# Patient Record
Sex: Male | Born: 1980 | Hispanic: Yes | Marital: Single | Smoking: Never smoker
Health system: Southern US, Community
[De-identification: ages and names within clinical notes are randomized; demographics above are authoritative.]

## PROBLEM LIST (undated history)

## (undated) DIAGNOSIS — K5190 Ulcerative colitis, unspecified, without complications: Secondary | ICD-10-CM

## (undated) DIAGNOSIS — D500 Iron deficiency anemia secondary to blood loss (chronic): Secondary | ICD-10-CM

---

## 2018-12-22 ENCOUNTER — Emergency Department (HOSPITAL_COMMUNITY)
Admission: EM | Admit: 2018-12-22 | Discharge: 2018-12-22 | Disposition: A | Discharge status: Home / Self Care | Payer: Self-pay | Attending: Emergency Medicine | Admitting: Emergency Medicine

## 2018-12-22 ENCOUNTER — Other Ambulatory Visit: Payer: Self-pay

## 2018-12-22 ENCOUNTER — Encounter (HOSPITAL_COMMUNITY): Payer: Self-pay | Admitting: Emergency Medicine

## 2018-12-22 ENCOUNTER — Emergency Department (HOSPITAL_COMMUNITY): Payer: Self-pay

## 2018-12-22 DIAGNOSIS — D649 Anemia, unspecified: Secondary | ICD-10-CM | POA: Insufficient documentation

## 2018-12-22 DIAGNOSIS — K529 Noninfective gastroenteritis and colitis, unspecified: Secondary | ICD-10-CM | POA: Insufficient documentation

## 2018-12-22 LAB — COMPREHENSIVE METABOLIC PANEL
ALT: 14 U/L (ref 0–44)
AST: 13 U/L — ABNORMAL LOW (ref 15–41)
Albumin: 2.1 g/dL — ABNORMAL LOW (ref 3.5–5.0)
Alkaline Phosphatase: 109 U/L (ref 38–126)
Anion gap: 9 (ref 5–15)
BUN: 11 mg/dL (ref 6–20)
CO2: 25 mmol/L (ref 22–32)
Calcium: 8 mg/dL — ABNORMAL LOW (ref 8.9–10.3)
Chloride: 97 mmol/L — ABNORMAL LOW (ref 98–111)
Creatinine, Ser: 0.82 mg/dL (ref 0.61–1.24)
GFR calc Af Amer: >60 mL/min (ref >60)
GFR calc non Af Amer: >60 mL/min (ref >60)
Glucose, Bld: 103 mg/dL — ABNORMAL HIGH (ref 70–99)
Potassium: 4.2 mmol/L (ref 3.5–5.1)
Sodium: 131 mmol/L — ABNORMAL LOW (ref 135–145)
Total Bilirubin: 0.5 mg/dL (ref 0.3–1.2)
Total Protein: 6.2 g/dL — ABNORMAL LOW (ref 6.5–8.1)

## 2018-12-22 LAB — LIPASE, BLOOD: Lipase: 15 U/L (ref 11–51)

## 2018-12-22 LAB — CBC
HCT: 30.3 % — ABNORMAL LOW (ref 39.0–52.0)
Hemoglobin: 9.2 g/dL — ABNORMAL LOW (ref 13.0–17.0)
MCH: 26.3 pg (ref 26.0–34.0)
MCHC: 30.4 g/dL (ref 30.0–36.0)
MCV: 86.6 fL (ref 80.0–100.0)
Platelets: 495 10*3/uL — ABNORMAL HIGH (ref 150–400)
RBC: 3.5 MIL/uL — ABNORMAL LOW (ref 4.22–5.81)
RDW: 13.9 % (ref 11.5–15.5)
WBC: 7.7 10*3/uL (ref 4.0–10.5)
nRBC: 0 % (ref 0.0–0.2)

## 2018-12-22 LAB — URINALYSIS, ROUTINE W REFLEX MICROSCOPIC
Bilirubin Urine: NEGATIVE
Glucose, UA: NEGATIVE mg/dL
Hgb urine dipstick: NEGATIVE
Ketones, ur: 5 mg/dL — AB
Leukocytes,Ua: NEGATIVE
Nitrite: NEGATIVE
Protein, ur: NEGATIVE mg/dL
Specific Gravity, Urine: 1.011 (ref 1.005–1.030)
pH: 7 (ref 5.0–8.0)

## 2018-12-22 MED ORDER — IOHEXOL 300 MG/ML  SOLN
30.0000 mL | Freq: Once | INTRAMUSCULAR | Status: AC | PRN
  Administered 2018-12-22: 30 mL via ORAL

## 2018-12-22 MED ORDER — METRONIDAZOLE 500 MG PO TABS
500.0000 mg | ORAL_TABLET | Freq: Once | ORAL | Status: AC
  Administered 2018-12-22: 500 mg via ORAL
  Filled 2018-12-22: qty 1

## 2018-12-22 MED ORDER — IOHEXOL 300 MG/ML  SOLN
100.0000 mL | Freq: Once | INTRAMUSCULAR | Status: AC | PRN
  Administered 2018-12-22: 100 mL via INTRAVENOUS

## 2018-12-22 MED ORDER — CIPROFLOXACIN HCL 500 MG PO TABS
500.0000 mg | ORAL_TABLET | Freq: Once | ORAL | Status: AC
  Administered 2018-12-22: 500 mg via ORAL
  Filled 2018-12-22: qty 1

## 2018-12-22 MED ORDER — SODIUM CHLORIDE 0.9 % IV BOLUS (SEPSIS)
1000.0000 mL | Freq: Once | INTRAVENOUS | Status: AC
  Administered 2018-12-22: 16:00:00 1000 mL via INTRAVENOUS

## 2018-12-22 MED ORDER — SODIUM CHLORIDE 0.9 % IV SOLN: 1000.0000 mL | INTRAVENOUS | Status: DC

## 2018-12-22 MED ORDER — METRONIDAZOLE 500 MG PO TABS: 500.0000 mg | ORAL_TABLET | Freq: Three times a day (TID) | ORAL | 0 refills | Status: DC

## 2018-12-22 MED ORDER — SODIUM CHLORIDE (PF) 0.9 % IJ SOLN
INTRAMUSCULAR | Status: AC
  Filled 2018-12-22: qty 50

## 2018-12-22 MED ORDER — CIPROFLOXACIN HCL 500 MG PO TABS: 500.0000 mg | ORAL_TABLET | Freq: Two times a day (BID) | ORAL | 0 refills | Status: DC

## 2018-12-22 NOTE — ED Triage Notes (Signed)
Pt c/o abd pains with v/d for 4 weeks.

## 2018-12-22 NOTE — ED Provider Notes (Signed)
[facility]-EMERGENCY DEPT Provider Note   CSN: [id number] Arrival date & time: 12/22/18  1457    History   Chief Complaint Chief Complaint  Patient presents with   Abdominal Pain   Emesis   Diarrhea    HPI Cesar Miller is a 38 y.o. male.     HPI Pt has been having abdominal pain, vomiting and diarrhea.  He has been having diarrhea for 4 months.  During the day not too much, but at night he will have episodes every 20 to 30 minutes.  He has noticed blood in the stool at times.  Last night he had an episode of vomiting but prior to that it was 4 days ago.  The pain in the abdomen is up top and just sometimes.  It is not too severe. He has not seen anyone for it since it started.  He came in today because he could not sleep because he was having more trouble and could not sleep.  No fevers.    Pt used to drink alcohol daily but has not had anything to drink in months.   NO family history of medical problems.  History reviewed. No pertinent past medical history.  There are no active problems to display for this patient.   History reviewed. No pertinent surgical history.      Home Medications    Prior to Admission medications   Medication Sig Start Date End Date Taking? Authorizing Provider  ciprofloxacin (CIPRO) 500 MG tablet Take 1 tablet (500 mg total) by mouth 2 (two) times daily. 12/22/18   Linwood Dibbles, MD  metroNIDAZOLE (FLAGYL) 500 MG tablet Take 1 tablet (500 mg total) by mouth 3 (three) times daily. 12/22/18   Linwood Dibbles, MD    Family History No family history on file.  Social History Social History   Tobacco Use   Smoking status: Never Smoker   Smokeless tobacco: Never Used  Substance Use Topics   Alcohol use: Not on file   Drug use: Not on file     Allergies   Patient has no known allergies.   Review of Systems Review of Systems  All other systems reviewed and are negative.    Physical Exam Updated Vital  Signs BP 120/87    Pulse 89    Temp 98.7 F (37.1 C)    Resp 16    SpO2 100%   Physical Exam Vitals signs and nursing note reviewed.  Constitutional:      General: He is not in acute distress.    Appearance: He is well-developed.  HENT:     Head: Normocephalic and atraumatic.     Right Ear: External ear normal.     Left Ear: External ear normal.  Eyes:     General: No scleral icterus.       Right eye: No discharge.        Left eye: No discharge.     Conjunctiva/sclera: Conjunctivae normal.     Comments: Conjunctiva pale   Neck:     Musculoskeletal: Neck supple.     Trachea: No tracheal deviation.  Cardiovascular:     Rate and Rhythm: Normal rate and regular rhythm.  Pulmonary:     Effort: Pulmonary effort is normal. No respiratory distress.     Breath sounds: Normal breath sounds. No stridor. No wheezing or rales.  Abdominal:     General: Bowel sounds are normal. There is no distension.     Palpations: Abdomen is soft.  Tenderness: There is no abdominal tenderness. There is no guarding or rebound.  Musculoskeletal:        General: No tenderness.  Skin:    General: Skin is warm and dry.     Findings: No rash.  Neurological:     Mental Status: He is alert.     Cranial Nerves: No cranial nerve deficit (no facial droop, extraocular movements intact, no slurred speech).     Sensory: No sensory deficit.     Motor: No abnormal muscle tone or seizure activity.     Coordination: Coordination normal.      ED Treatments / Results  Labs (all labs ordered are listed, but only abnormal results are displayed) Labs Reviewed  COMPREHENSIVE METABOLIC PANEL - Abnormal; Notable for the following components:      Result Value   Sodium 131 (*)    Chloride 97 (*)    Glucose, Bld 103 (*)    Calcium 8.0 (*)    Total Protein 6.2 (*)    Albumin 2.1 (*)    AST 13 (*)    All other components within normal limits  CBC - Abnormal; Notable for the following components:   RBC 3.50 (*)     Hemoglobin 9.2 (*)    HCT 30.3 (*)    Platelets 495 (*)    All other components within normal limits  URINALYSIS, ROUTINE W REFLEX MICROSCOPIC - Abnormal; Notable for the following components:   Ketones, ur 5 (*)    All other components within normal limits  LIPASE, BLOOD     Radiology Ct Abdomen Pelvis W Contrast  Result Date: 12/22/2018 CLINICAL DATA:  Abdominal pain EXAM: CT ABDOMEN AND PELVIS WITH CONTRAST TECHNIQUE: Multidetector CT imaging of the abdomen and pelvis was performed using the standard protocol following bolus administration of intravenous contrast. CONTRAST:  100mL OMNIPAQUE IOHEXOL 300 MG/ML SOLN, 30mL OMNIPAQUE IOHEXOL 300 MG/ML SOLN COMPARISON:  None. FINDINGS: Lower chest: Lung bases are clear. No effusions. Heart is normal size. Hepatobiliary: Low-density area anteriorly near the falciform ligament could reflect fatty infiltration or cyst. Gallbladder is grossly unremarkable. Pancreas: No focal abnormality or ductal dilatation. Spleen: No focal abnormality.  Normal size. Adrenals/Urinary Tract: No adrenal abnormality. No focal renal abnormality. No stones or hydronephrosis. Urinary bladder is unremarkable. Stomach/Bowel: There is wall thickening within the left colon from the splenic flexure through the sigmoid colon compatible with infectious or inflammatory colitis. Stomach and small bowel decompressed. No evidence of bowel obstruction. Vascular/Lymphatic: No evidence of aneurysm or adenopathy. Numerous mildly prominent mesenteric lymph nodes adjacent to the left colon. Reproductive: No visible focal abnormality. Other: No free fluid or free air. Musculoskeletal: No acute bony abnormality. IMPRESSION: Wall thickening in the left colon from the splenic flexure through the sigmoid colon with associated surrounding stranding and prominent mesenteric lymph nodes. Findings compatible with infectious or inflammatory colitis. Electronically Signed   By: Charlett NoseKevin  Dover M.D.   On:  12/22/2018 21:23    Procedures Procedures (including critical care time)  Medications Ordered in ED Medications  sodium chloride 0.9 % bolus 1,000 mL (0 mLs Intravenous Stopped 12/22/18 1806)    Followed by  sodium chloride 0.9 % bolus 1,000 mL (0 mLs Intravenous Stopped 12/22/18 1806)  iohexol (OMNIPAQUE) 300 MG/ML solution 100 mL (100 mLs Intravenous Contrast Given 12/22/18 2107)  iohexol (OMNIPAQUE) 300 MG/ML solution 30 mL (30 mLs Oral Contrast Given 12/22/18 2107)  ciprofloxacin (CIPRO) tablet 500 mg (500 mg Oral Given 12/22/18 2212)  metroNIDAZOLE (FLAGYL) tablet  500 mg (500 mg Oral Given 12/22/18 2212)     Initial Impression / Assessment and Plan / ED Course  I have reviewed the triage vital signs and the nursing notes.  Pertinent labs & imaging results that were available during my care of the patient were reviewed by me and considered in my medical decision making (see chart for details).  Clinical Course as of Dec 23 908  Sat Dec 22, 2018  1711 HGB low.  CMET otherwise unremarkable   [JK]  1753 No gross blood in stool.  Guaiac negative   [JK]  2134 CT scan reviewed   [JK]  2137 Findings consistent with inflammatory/infectious colitis.  Suspect inflammatory considering the chronicity   [JK]  2148 D/w Dr Georgiann Cocker.  OK to Dc on abx.  Follow up in office.    [JK]    Clinical Course User Index [JK] Linwood Dibbles, MD     Pt presented with complaints of recurrent abdominal pain for months.  Recently noticed blood in his stools.  Labs notable for an anemia that is new compared to previous labs this year available in the EMR.  CT scan showed findings consistent with colitis.  No gross blood in stool to suggest active bleeding, guaic neg.  Sx concerning for possible inflam coitis considering the chronicity.  Discussed with Dr. Georgiann Cocker, GI for recommendations.  Dc home on abx.  Outpatient follow up in GI office.  Final Clinical Impressions(s) / ED Diagnoses   Final diagnoses:    Colitis  Anemia, unspecified type    ED Discharge Orders         Ordered    ciprofloxacin (CIPRO) 500 MG tablet  2 times daily     12/22/18 2201    metroNIDAZOLE (FLAGYL) 500 MG tablet  3 times daily     12/22/18 2201        Note:spanish language translator used during visit at all times   Linwood Dibbles, MD 12/24/18 [phone]

## 2018-12-22 NOTE — ED Notes (Signed)
Pt back from CT

## 2018-12-22 NOTE — ED Notes (Signed)
ED Provider at bedside. 

## 2018-12-22 NOTE — ED Notes (Signed)
UA sent down to lab with culture

## 2018-12-22 NOTE — Discharge Instructions (Addendum)
Take the medications until they are completely finished.  Call Dr Rito Ehrlich office on Monday to schedule an appointment.  Tome los medicamentos hasta que estn completamente terminados. Llame a la oficina del Dr. Chancy Milroy el lunes para programar una cita.

## 2018-12-22 NOTE — ED Notes (Signed)
Pt ambulated to CT

## 2018-12-22 NOTE — ED Notes (Signed)
Patient transported to CT 

## 2018-12-22 NOTE — ED Notes (Signed)
Patient made aware urine sample is needed. Urinal provided at bedside. 

## 2019-02-23 ENCOUNTER — Other Ambulatory Visit: Payer: Self-pay

## 2019-02-23 ENCOUNTER — Encounter (HOSPITAL_COMMUNITY): Payer: Self-pay

## 2019-02-23 ENCOUNTER — Emergency Department (HOSPITAL_COMMUNITY)
Admission: EM | Admit: 2019-02-23 | Discharge: 2019-02-23 | Disposition: A | Discharge status: Home / Self Care | Payer: Self-pay | Attending: Emergency Medicine | Admitting: Emergency Medicine

## 2019-02-23 DIAGNOSIS — R197 Diarrhea, unspecified: Secondary | ICD-10-CM | POA: Insufficient documentation

## 2019-02-23 DIAGNOSIS — R34 Anuria and oliguria: Secondary | ICD-10-CM | POA: Insufficient documentation

## 2019-02-23 DIAGNOSIS — R300 Dysuria: Secondary | ICD-10-CM | POA: Insufficient documentation

## 2019-02-23 DIAGNOSIS — K921 Melena: Secondary | ICD-10-CM | POA: Insufficient documentation

## 2019-02-23 DIAGNOSIS — R1011 Right upper quadrant pain: Secondary | ICD-10-CM | POA: Insufficient documentation

## 2019-02-23 DIAGNOSIS — R6883 Chills (without fever): Secondary | ICD-10-CM | POA: Insufficient documentation

## 2019-02-23 LAB — CBC WITH DIFFERENTIAL/PLATELET
Abs Immature Granulocytes: 0.03 10*3/uL (ref 0.00–0.07)
Basophils Absolute: 0 10*3/uL (ref 0.0–0.1)
Basophils Relative: 0 %
Eosinophils Absolute: 0 10*3/uL (ref 0.0–0.5)
Eosinophils Relative: 0 %
HCT: 34.4 % — ABNORMAL LOW (ref 39.0–52.0)
Hemoglobin: 10.4 g/dL — ABNORMAL LOW (ref 13.0–17.0)
Immature Granulocytes: 0 %
Lymphocytes Relative: 28 %
Lymphs Abs: 2 10*3/uL (ref 0.7–4.0)
MCH: 25.7 pg — ABNORMAL LOW (ref 26.0–34.0)
MCHC: 30.2 g/dL (ref 30.0–36.0)
MCV: 85.1 fL (ref 80.0–100.0)
Monocytes Absolute: 0.6 10*3/uL (ref 0.1–1.0)
Monocytes Relative: 8 %
Neutro Abs: 4.6 10*3/uL (ref 1.7–7.7)
Neutrophils Relative %: 64 %
Platelets: 605 10*3/uL — ABNORMAL HIGH (ref 150–400)
RBC: 4.04 MIL/uL — ABNORMAL LOW (ref 4.22–5.81)
RDW: 15.2 % (ref 11.5–15.5)
WBC: 7.2 10*3/uL (ref 4.0–10.5)
nRBC: 0 % (ref 0.0–0.2)

## 2019-02-23 LAB — URINALYSIS, ROUTINE W REFLEX MICROSCOPIC
Bacteria, UA: NONE SEEN
Glucose, UA: NEGATIVE mg/dL
Hgb urine dipstick: NEGATIVE
Ketones, ur: 5 mg/dL — AB
Leukocytes,Ua: NEGATIVE
Nitrite: NEGATIVE
Protein, ur: 30 mg/dL — AB
Specific Gravity, Urine: 1.028 (ref 1.005–1.030)
pH: 5 (ref 5.0–8.0)

## 2019-02-23 LAB — COMPREHENSIVE METABOLIC PANEL
ALT: 15 U/L (ref 0–44)
AST: 14 U/L — ABNORMAL LOW (ref 15–41)
Albumin: 2 g/dL — ABNORMAL LOW (ref 3.5–5.0)
Alkaline Phosphatase: 134 U/L — ABNORMAL HIGH (ref 38–126)
Anion gap: 10 (ref 5–15)
BUN: 11 mg/dL (ref 6–20)
CO2: 26 mmol/L (ref 22–32)
Calcium: 8.2 mg/dL — ABNORMAL LOW (ref 8.9–10.3)
Chloride: 98 mmol/L (ref 98–111)
Creatinine, Ser: 0.75 mg/dL (ref 0.61–1.24)
GFR calc Af Amer: >60 mL/min (ref >60)
GFR calc non Af Amer: >60 mL/min (ref >60)
Glucose, Bld: 88 mg/dL (ref 70–99)
Potassium: 4 mmol/L (ref 3.5–5.1)
Sodium: 134 mmol/L — ABNORMAL LOW (ref 135–145)
Total Bilirubin: 0.4 mg/dL (ref 0.3–1.2)
Total Protein: 6.6 g/dL (ref 6.5–8.1)

## 2019-02-23 LAB — LIPASE, BLOOD: Lipase: 58 U/L — ABNORMAL HIGH (ref 11–51)

## 2019-02-23 MED ORDER — SODIUM CHLORIDE 0.9 % IV BOLUS
1000.0000 mL | Freq: Once | INTRAVENOUS | Status: AC
  Administered 2019-02-23: 1000 mL via INTRAVENOUS

## 2019-02-23 NOTE — ED Notes (Signed)
Bladder scan 39mL.

## 2019-02-23 NOTE — ED Provider Notes (Signed)
[facility]-EMERGENCY DEPT Provider Note   CSN: [id number] Arrival date & time: 02/23/19  1017    History   Chief Complaint Chief Complaint  Patient presents with  . Dysuria    HPI Cesar Miller is a 38 y.o. male.  His chief complaint is dysuria.  Symptoms started primary plenty since yesterday morning.  States he noted some burning every time he tried to pee and also noticed that there was a decreased amount of urine coming out.  States symptoms have not changed significantly since yesterday.  Currently denies any active abdominal pain at rest.  Says over the past 3 weeks he has noted some "pain in liver", states this is worsened when eating foods, unable to identify specific food triggers.  Currently not having any pain in his right upper quadrant.  Had chills yesterday, denies any documented fevers.  Denies any nausea, vomiting.  Cough, chest pain or difficulty breathing.  Patient denies any medical problems.  Patient was seen in our emergency department earlier this year, diagnosed with colitis and completed antibiotic treatment.  Also having blood in his stools and had hemoglobin 9.2.  Patient has not followed up with outpatient doctor since that time.  States he continues to have intermittent loose stools with occasional streaks of blood, last episode was a few days ago.  Has since had normal bowel movements.  Additional history is obtained through chart review.  Previously documented history of alcohol abuse, patient denies any ongoing alcohol use.     HPI  History reviewed. No pertinent past medical history.  There are no active problems to display for this patient.   History reviewed. No pertinent surgical history.      Home Medications    Prior to Admission medications   Medication Sig Start Date End Date Taking? Authorizing Provider  ciprofloxacin (CIPRO) 500 MG tablet Take 1 tablet (500 mg total) by mouth 2 (two) times daily. Patient  not taking: Reported on 02/23/2019 12/22/18   Linwood DibblesKnapp, Jon, MD  metroNIDAZOLE (FLAGYL) 500 MG tablet Take 1 tablet (500 mg total) by mouth 3 (three) times daily. Patient not taking: Reported on 02/23/2019 12/22/18   Linwood DibblesKnapp, Jon, MD    Family History No family history on file.  Social History Social History   Tobacco Use  . Smoking status: Never Smoker  . Smokeless tobacco: Never Used  Substance Use Topics  . Alcohol use: Not on file  . Drug use: Not on file     Allergies   Patient has no known allergies.   Review of Systems Review of Systems  Constitutional: Negative for chills and fever.  HENT: Negative for ear pain and sore throat.   Eyes: Negative for pain and visual disturbance.  Respiratory: Negative for cough and shortness of breath.   Cardiovascular: Negative for chest pain and palpitations.  Gastrointestinal: Positive for abdominal pain. Negative for vomiting.  Genitourinary: Positive for dysuria and hematuria.  Musculoskeletal: Negative for arthralgias and back pain.  Skin: Negative for color change and rash.  Neurological: Negative for seizures and syncope.  All other systems reviewed and are negative.    Physical Exam Updated Vital Signs BP 107/80   Pulse 75   Temp 98.6 F (37 C) (Oral)   Resp 16   Wt 65.8 kg   SpO2 99%   Physical Exam Vitals signs and nursing note reviewed.  Constitutional:      Appearance: He is well-developed.  HENT:     Head: Normocephalic and atraumatic.  Eyes:     Conjunctiva/sclera: Conjunctivae normal.  Neck:     Musculoskeletal: Neck supple.  Cardiovascular:     Rate and Rhythm: Regular rhythm. Tachycardia present.     Heart sounds: No murmur.  Pulmonary:     Effort: Pulmonary effort is normal. No respiratory distress.     Breath sounds: Normal breath sounds.  Abdominal:     Palpations: Abdomen is soft.     Tenderness: There is no abdominal tenderness.  Skin:    General: Skin is warm and dry.  Neurological:     Mental  Status: He is alert.      ED Treatments / Results  Labs (all labs ordered are listed, but only abnormal results are displayed) Labs Reviewed  URINALYSIS, ROUTINE W REFLEX MICROSCOPIC - Abnormal; Notable for the following components:      Result Value   Color, Urine AMBER (*)    APPearance HAZY (*)    Bilirubin Urine SMALL (*)    Ketones, ur 5 (*)    Protein, ur 30 (*)    Crystals PRESENT (*)    All other components within normal limits  CBC WITH DIFFERENTIAL/PLATELET - Abnormal; Notable for the following components:   RBC 4.04 (*)    Hemoglobin 10.4 (*)    HCT 34.4 (*)    MCH 25.7 (*)    Platelets 605 (*)    All other components within normal limits  COMPREHENSIVE METABOLIC PANEL - Abnormal; Notable for the following components:   Sodium 134 (*)    Calcium 8.2 (*)    Albumin 2.0 (*)    AST 14 (*)    Alkaline Phosphatase 134 (*)    All other components within normal limits  LIPASE, BLOOD - Abnormal; Notable for the following components:   Lipase 58 (*)    All other components within normal limits    EKG None  Radiology No results found.  Procedures Procedures (including critical care time)  Medications Ordered in ED Medications  sodium chloride 0.9 % bolus 1,000 mL (0 mLs Intravenous Stopped 02/23/19 1348)     Initial Impression / Assessment and Plan / ED Course  I have reviewed the triage vital signs and the nursing notes.  Pertinent labs & imaging results that were available during my care of the patient were reviewed by me and considered in my medical decision making (see chart for details).  Clinical Course as of Feb 22 2209  Sat Feb 23, 2019  1243 Completed chart review, completed initial assessment   [RD]  1412 Reviewed results, rechecked patient, will dc home   [RD]    Clinical Course User Index [RD] Lucrezia Starch, MD       38 year old gentleman presents emergency department with chief complaint of dysuria.  Labs unremarkable, urine  without evidence for infection.  No urinary retention.  Patient otherwise well-appearing and stable vital signs.  Believe further outpatient management this time, recommended recheck with primary doctor.  Will discharge home.  Spanish interpreter used throughout duration of visit via language line.    After the discussed management above, the patient was determined to be safe for discharge.  The patient was in agreement with this plan and all questions regarding their care were answered.  ED return precautions were discussed and the patient will return to the ED with any significant worsening of condition.    Final Clinical Impressions(s) / ED Diagnoses   Final diagnoses:  Dysuria    ED Discharge Orders  None       Milagros Lollykstra, Kadia Abaya S, MD 02/23/19 2210

## 2019-02-23 NOTE — ED Triage Notes (Addendum)
Pt states pain with urination since yesterday morning. Pt states he has only been able to urinate a very small amount and there has been burning. Pt also c/o pain in his liver. Denies [place] [phone]

## 2020-11-24 IMAGING — CT CT ABDOMEN AND PELVIS WITH CONTRAST
2 of 4 series · 16 of 46 positions shown, 18 images · IV contrast (ISOVUE)
Comparison: None.

CLINICAL DATA: Abdominal pain

EXAM:
CT ABDOMEN AND PELVIS WITH CONTRAST
TECHNIQUE: Multidetector CT imaging of the abdomen and pelvis was performed
using the standard protocol following bolus administration of
intravenous contrast.
CONTRAST:  100mL OMNIPAQUE IOHEXOL 300 MG/ML SOLN, 30mL OMNIPAQUE
IOHEXOL 300 MG/ML SOLN

[Series 2: axial st · axial · 0.72mm/px · z∈[-439,-14]mm · 13 of 95 slices shown, 15 images]
[im 5/95  soft-tissue]
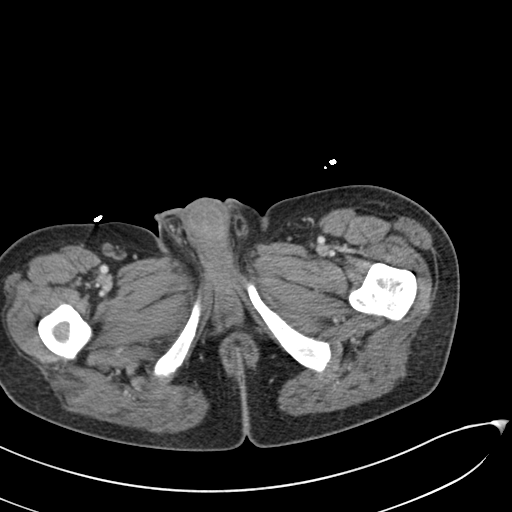
[im 5/95  bone]
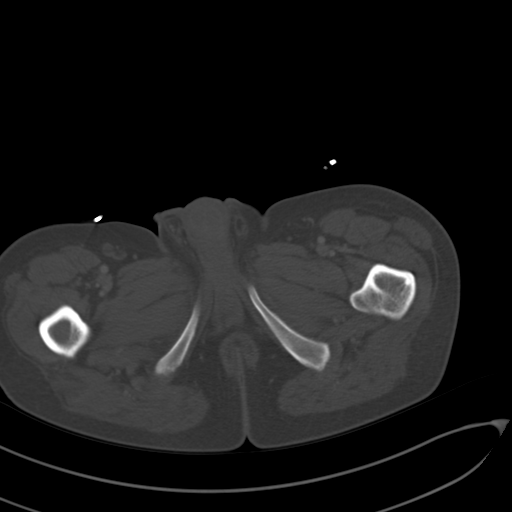
[im 15/95  soft-tissue]
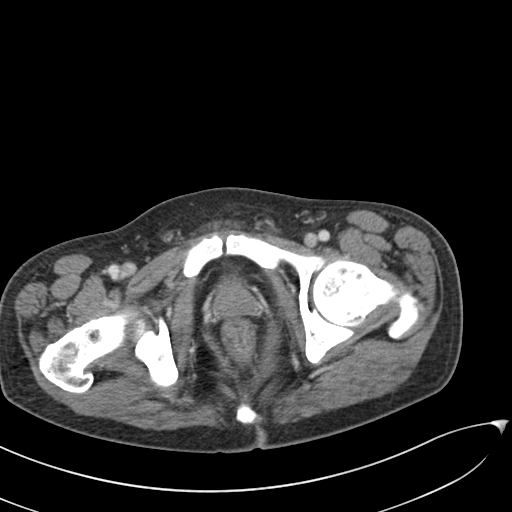
[im 19/95  soft-tissue]
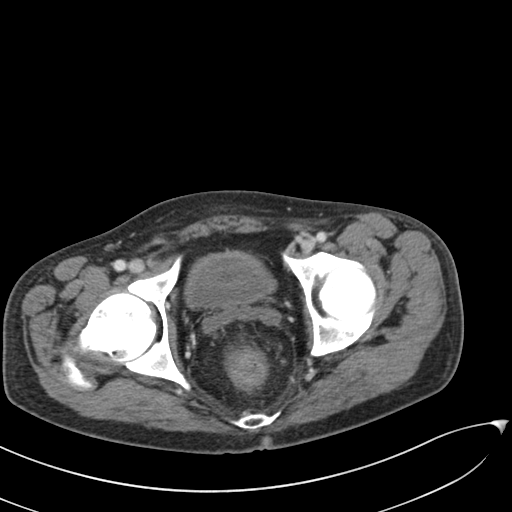
[im 29/95  soft-tissue]
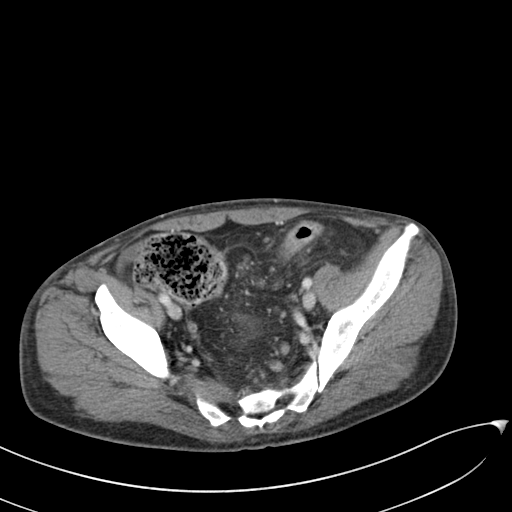
[im 33/95  soft-tissue]
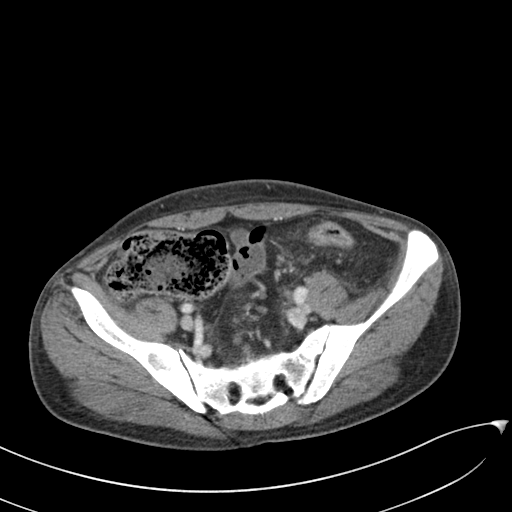
[im 43/95  soft-tissue]
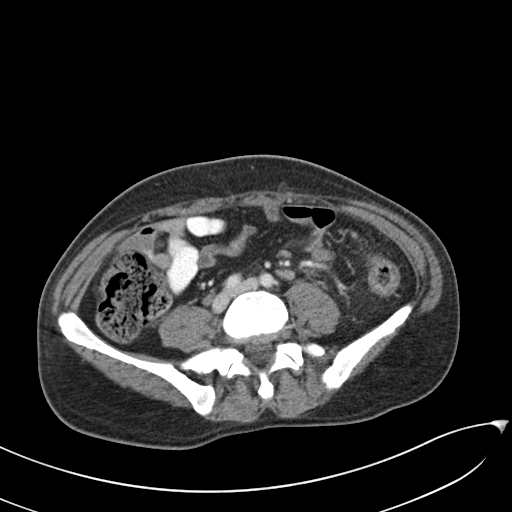
[im 48/95  soft-tissue]
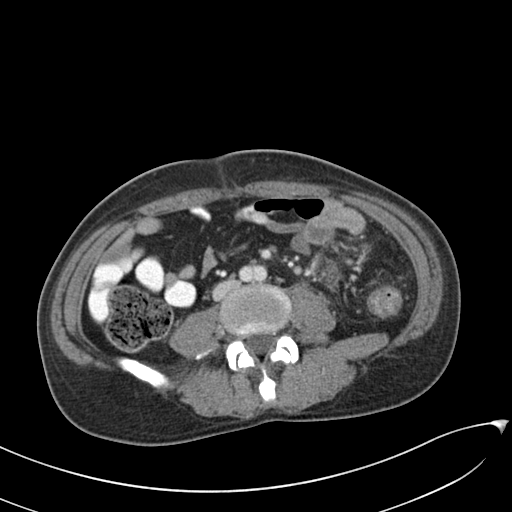
[im 52/95  soft-tissue]
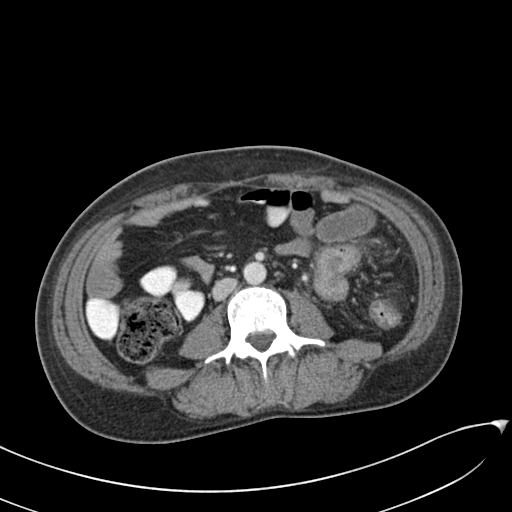
[im 62/95  soft-tissue]
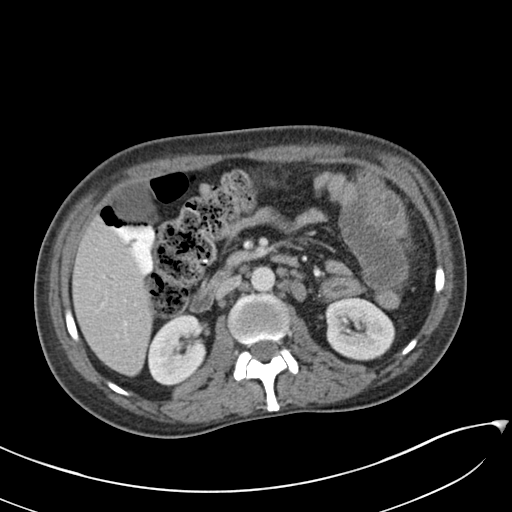
[im 62/95  bone]
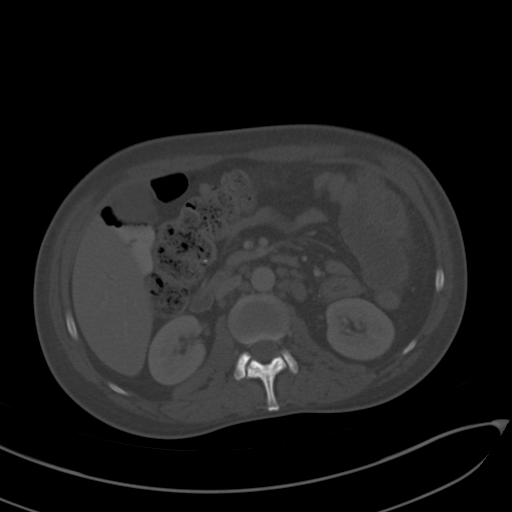
[im 66/95  soft-tissue]
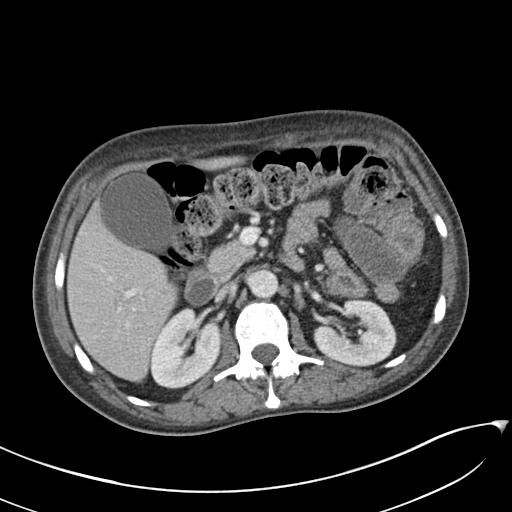
[im 76/95  soft-tissue]
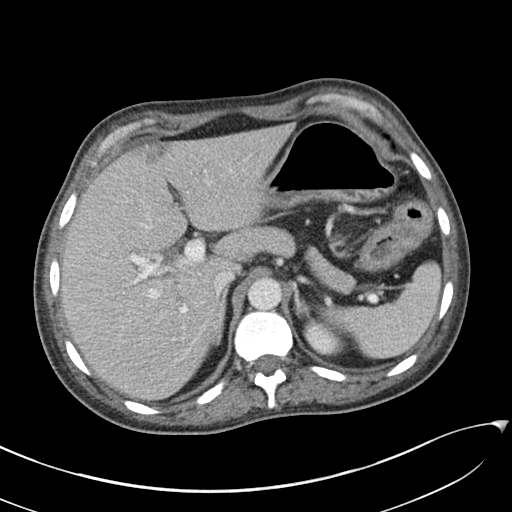
[im 80/95  soft-tissue]
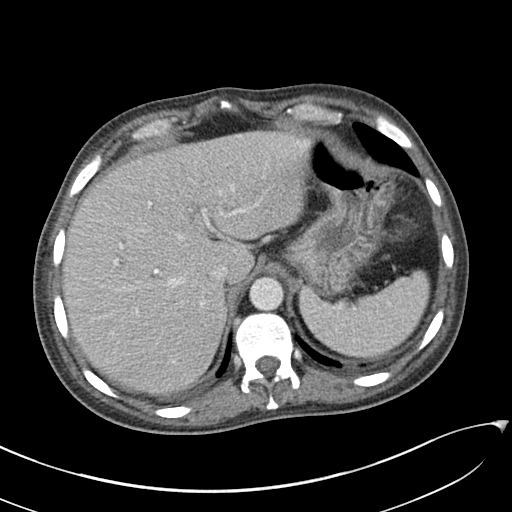
[im 90/95  soft-tissue]
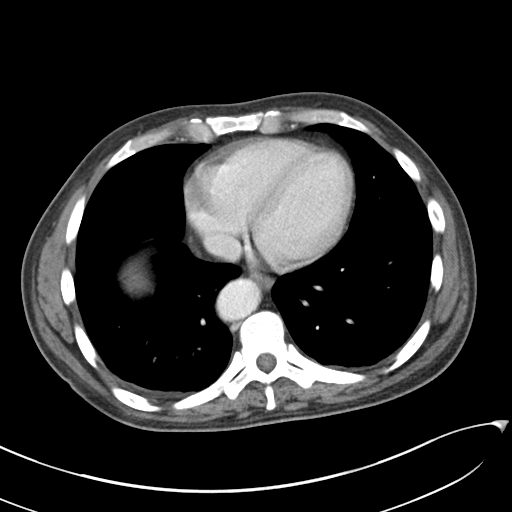

[Series 5: coronal st · coronal · 0.65mm/px · 3 of 131 slices shown]
[im 44/131  soft-tissue]
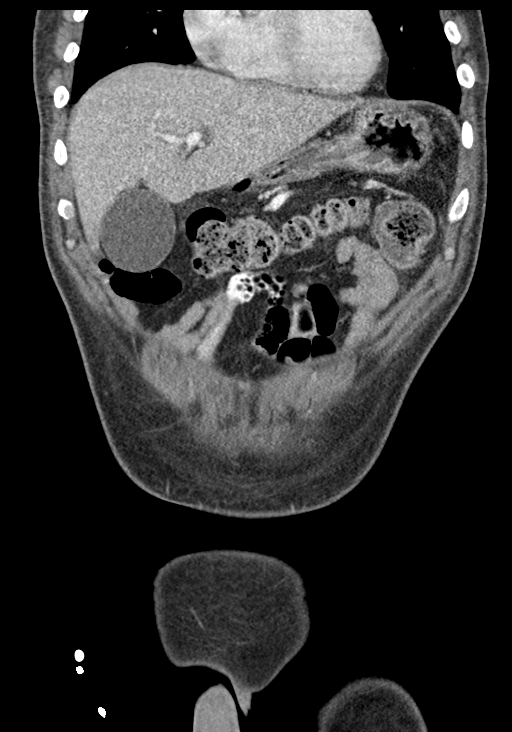
[im 58/131  soft-tissue]
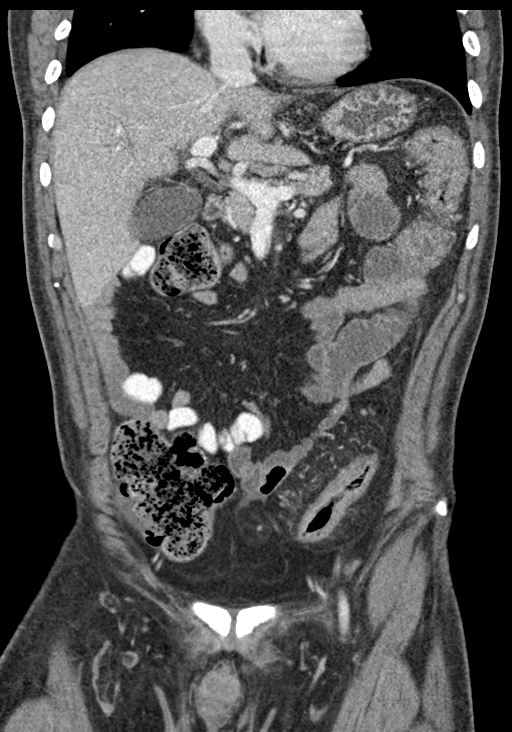
[im 73/131  soft-tissue]
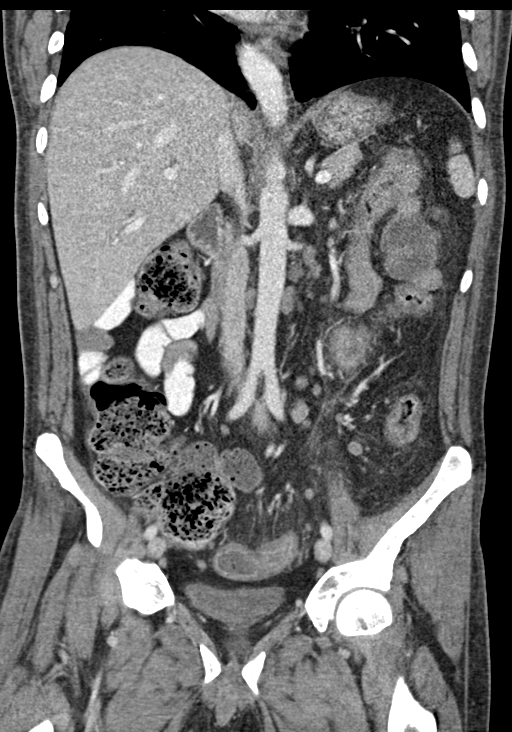

[16 of 46 positions shown; findings below may reference images not displayed]

FINDINGS: Lower chest: Lung bases are clear. No effusions. Heart is normal
size.

Hepatobiliary: Low-density area anteriorly near the falciform
ligament could reflect fatty infiltration or cyst. Gallbladder is
grossly unremarkable.

Pancreas: No focal abnormality or ductal dilatation.

Spleen: No focal abnormality.  Normal size.

Adrenals/Urinary Tract: No adrenal abnormality. No focal renal
abnormality. No stones or hydronephrosis. Urinary bladder is
unremarkable.

Stomach/Bowel: There is wall thickening within the left colon from
the splenic flexure through the sigmoid colon compatible with
infectious or inflammatory colitis. Stomach and small bowel
decompressed. No evidence of bowel obstruction.

Vascular/Lymphatic: No evidence of aneurysm or adenopathy. Numerous
mildly prominent mesenteric lymph nodes adjacent to the left colon.

Reproductive: No visible focal abnormality.

Other: No free fluid or free air.

Musculoskeletal: No acute bony abnormality.
IMPRESSION: Wall thickening in the left colon from the splenic flexure through
the sigmoid colon with associated surrounding stranding and
prominent mesenteric lymph nodes. Findings compatible with
infectious or inflammatory colitis.

## 2022-11-13 ENCOUNTER — Emergency Department (HOSPITAL_COMMUNITY)
Admission: EM | Admit: 2022-11-13 | Discharge: 2022-11-14 | Disposition: A | Discharge status: Home / Self Care | Payer: Self-pay | Attending: Emergency Medicine | Admitting: Emergency Medicine

## 2022-11-13 ENCOUNTER — Other Ambulatory Visit: Payer: Self-pay

## 2022-11-13 ENCOUNTER — Encounter (HOSPITAL_COMMUNITY): Payer: Self-pay | Admitting: Emergency Medicine

## 2022-11-13 DIAGNOSIS — K222 Esophageal obstruction: Secondary | ICD-10-CM | POA: Insufficient documentation

## 2022-11-13 HISTORY — DX: Iron deficiency anemia secondary to blood loss (chronic): D50.0

## 2022-11-13 HISTORY — DX: Ulcerative colitis, unspecified, without complications: K51.90

## 2022-11-13 MED ORDER — SODIUM CHLORIDE 0.9 % IV BOLUS
1000.0000 mL | Freq: Once | INTRAVENOUS | Status: AC
  Administered 2022-11-13: 1000 mL via INTRAVENOUS

## 2022-11-13 MED ORDER — GLUCAGON HCL RDNA (DIAGNOSTIC) 1 MG IJ SOLR
1.0000 mg | Freq: Once | INTRAMUSCULAR | Status: AC
  Administered 2022-11-13: 1 mg via INTRAVENOUS
  Filled 2022-11-13: qty 1

## 2022-11-13 NOTE — ED Provider Notes (Signed)
[facility]-EMERGENCY DEPT Provider Note: Lowella Dell, MD, FACEP  CSN: [id number] MRN: [id number] ARRIVAL: 11/13/22 at 2204 ROOM: WA14/WA14   CHIEF COMPLAINT  Vomiting   HISTORY OF PRESENT ILLNESS  11/13/22 11:20 PM Cesar Miller is a 42 y.o. male with a history of ulcerative colitis followed at [facility], last seen for this 01/06/2022.  He is here with 2 days of inability to swallow.  He ate a taco 2 mornings ago and feels like it got stuck in his upper esophagus.  It is painful when he tries to swallow liquids.  He rates his pain as a 6 out of 10.  His mouth is dry from not being able to drink fluids.  He denies any abdominal pain or diarrhea.   Past Medical History:  Diagnosis Date   Anemia due to chronic blood loss    Ulcerative colitis     No past surgical history on file.  No family history on file.  Social History   Tobacco Use   Smoking status: Never   Smokeless tobacco: Never    Prior to Admission medications   Not on File    Allergies Pork-derived products   REVIEW OF SYSTEMS  Negative except as noted here or in the History of Present Illness.   PHYSICAL EXAMINATION  Initial Vital Signs Blood pressure 121/76, pulse 98, temperature 98.1 F (36.7 C), temperature source Oral, resp. rate 18, weight 66 kg, SpO2 97 %.  Examination General: Well-developed, well-nourished male in no acute distress; appearance consistent with age of record HENT: normocephalic; atraumatic; dry mucous membranes Eyes: Normal appearance Neck: supple Heart: regular rate and rhythm Lungs: clear to auscultation bilaterally Abdomen: soft; nondistended; nontender; bowel sounds present Extremities: No deformity; full range of motion Neurologic: Awake, alert and oriented; motor function intact in all extremities and symmetric; no facial droop Skin: Warm and dry Psychiatric: Normal mood and affect   RESULTS  Summary of this visit's results, reviewed and interpreted by myself:    EKG Interpretation  Date/Time:    Ventricular Rate:    PR Interval:    QRS Duration:   QT Interval:    QTC Calculation:   R Axis:     Text Interpretation:         Laboratory Studies: Results for orders placed or performed during the hospital encounter of 11/13/22 (from the past 24 hour(s))  CBC with Differential     Status: Abnormal   Collection Time: 11/13/22 11:40 PM  Result Value Ref Range   WBC 9.0 4.0 - 10.5 K/uL   RBC 4.54 4.22 - 5.81 MIL/uL   Hemoglobin 9.2 (L) 13.0 - 17.0 g/dL   HCT 08.6 (L) 57.8 - 46.9 %   MCV 70.9 (L) 80.0 - 100.0 fL   MCH 20.3 (L) 26.0 - 34.0 pg   MCHC 28.6 (L) 30.0 - 36.0 g/dL   RDW 62.9 (H) 52.8 - 41.3 %   Platelets 423 (H) 150 - 400 K/uL   nRBC 0.0 0.0 - 0.2 %   Neutrophils Relative % 76 %   Neutro Abs 6.9 1.7 - 7.7 K/uL   Lymphocytes Relative 13 %   Lymphs Abs 1.1 0.7 - 4.0 K/uL   Monocytes Relative 9 %   Monocytes Absolute 0.8 0.1 - 1.0 K/uL   Eosinophils Relative 1 %   Eosinophils Absolute 0.1 0.0 - 0.5 K/uL   Basophils Relative 1 %   Basophils Absolute 0.1 0.0 - 0.1 K/uL   Immature Granulocytes 0 %   Abs  Immature Granulocytes 0.03 0.00 - 0.07 K/uL  Basic metabolic panel     Status: None   Collection Time: 11/13/22 11:40 PM  Result Value Ref Range   Sodium 141 135 - 145 mmol/L   Potassium 3.7 3.5 - 5.1 mmol/L   Chloride 106 98 - 111 mmol/L   CO2 23 22 - 32 mmol/L   Glucose, Bld 98 70 - 99 mg/dL   BUN 13 6 - 20 mg/dL   Creatinine, Ser 5.36 0.61 - 1.24 mg/dL   Calcium 9.0 8.9 - 64.4 mg/dL   GFR, Estimated >03 >47 mL/min   Anion gap 12 5 - 15   Imaging Studies: No results found.  ED COURSE and MDM  Nursing notes, initial and subsequent vitals signs, including pulse oximetry, reviewed and interpreted by myself.  Vitals:   11/13/22 2212 11/13/22 2213 11/14/22 0115  BP: 121/76  123/83  Pulse: 98  84  Resp: 18  17  Temp: 98.1 F (36.7 C)    TempSrc: Oral    SpO2: 97%  99%  Weight:  66 kg    Medications  sodium  chloride 0.9 % bolus 1,000 mL (1,000 mLs Intravenous New Bag/Given 11/13/22 2341)  glucagon (human recombinant) (GLUCAGEN) injection 1 mg (1 mg Intravenous Given 11/13/22 2348)   1:01 AM Patient able to swallow water after IV glucagon.  1:56 AM Patient has been able to drink 2 full glasses of water without regurgitation of any of it.  He no longer has any pain in his esophagus.  He likely had an esophageal obstruction which has resolved with IV glucagon.  He has been advised to chew food well before swallowing.  He should return if symptoms recur.  Otherwise he should follow-up with his gastroenterologist as he will likely need an upper endoscopy.   PROCEDURES  Procedures   ED DIAGNOSES     ICD-10-CM   1. Esophageal obstruction  K22.2          Yelina Sarratt, MD 11/14/22 [phone]

## 2022-11-13 NOTE — ED Triage Notes (Addendum)
Pt tells staff that every time he tries to eat anything that he vomits, feels like the food gets "stuck" x2days. Continues to feel the sensation of something being stuck intermittently.  Last solid food intake Friday afternoon, has been able to drink up until yesterday.  Previous episodes that resolved spontaneous.  Denies drooling.  Able to speak in complete sentences but clears throat frequently

## 2022-11-14 LAB — CBC WITH DIFFERENTIAL/PLATELET
Abs Immature Granulocytes: 0.03 10*3/uL (ref 0.00–0.07)
Basophils Absolute: 0.1 10*3/uL (ref 0.0–0.1)
Basophils Relative: 1 %
Eosinophils Absolute: 0.1 10*3/uL (ref 0.0–0.5)
Eosinophils Relative: 1 %
HCT: 32.2 % — ABNORMAL LOW (ref 39.0–52.0)
Hemoglobin: 9.2 g/dL — ABNORMAL LOW (ref 13.0–17.0)
Immature Granulocytes: 0 %
Lymphocytes Relative: 13 %
Lymphs Abs: 1.1 10*3/uL (ref 0.7–4.0)
MCH: 20.3 pg — ABNORMAL LOW (ref 26.0–34.0)
MCHC: 28.6 g/dL — ABNORMAL LOW (ref 30.0–36.0)
MCV: 70.9 fL — ABNORMAL LOW (ref 80.0–100.0)
Monocytes Absolute: 0.8 10*3/uL (ref 0.1–1.0)
Monocytes Relative: 9 %
Neutro Abs: 6.9 10*3/uL (ref 1.7–7.7)
Neutrophils Relative %: 76 %
Platelets: 423 10*3/uL — ABNORMAL HIGH (ref 150–400)
RBC: 4.54 MIL/uL (ref 4.22–5.81)
RDW: 19.2 % — ABNORMAL HIGH (ref 11.5–15.5)
WBC: 9 10*3/uL (ref 4.0–10.5)
nRBC: 0 % (ref 0.0–0.2)

## 2022-11-14 LAB — BASIC METABOLIC PANEL
Anion gap: 12 (ref 5–15)
BUN: 13 mg/dL (ref 6–20)
CO2: 23 mmol/L (ref 22–32)
Calcium: 9 mg/dL (ref 8.9–10.3)
Chloride: 106 mmol/L (ref 98–111)
Creatinine, Ser: 0.71 mg/dL (ref 0.61–1.24)
GFR, Estimated: >60 mL/min (ref >60)
Glucose, Bld: 98 mg/dL (ref 70–99)
Potassium: 3.7 mmol/L (ref 3.5–5.1)
Sodium: 141 mmol/L (ref 135–145)
